# Patient Record
Sex: Female | Born: 2005 | Race: White | Hispanic: Yes | Marital: Single | Smoking: Never smoker
Health system: Southern US, Community
[De-identification: ages and names within clinical notes are randomized; demographics above are authoritative.]

---
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
## 2023-01-02 DIAGNOSIS — Z419 Encounter for procedure for purposes other than remedying health state, unspecified: Secondary | ICD-10-CM | POA: Diagnosis not present

## 2023-02-02 DIAGNOSIS — Z419 Encounter for procedure for purposes other than remedying health state, unspecified: Secondary | ICD-10-CM | POA: Diagnosis not present

## 2023-03-04 DIAGNOSIS — Z419 Encounter for procedure for purposes other than remedying health state, unspecified: Secondary | ICD-10-CM | POA: Diagnosis not present

## 2023-04-04 DIAGNOSIS — Z419 Encounter for procedure for purposes other than remedying health state, unspecified: Secondary | ICD-10-CM | POA: Diagnosis not present

## 2023-04-09 DIAGNOSIS — Z7182 Exercise counseling: Secondary | ICD-10-CM | POA: Diagnosis not present

## 2023-04-09 DIAGNOSIS — Z130 Encounter for screening for diseases of the blood and blood-forming organs and certain disorders involving the immune mechanism: Secondary | ICD-10-CM | POA: Diagnosis not present

## 2023-04-09 DIAGNOSIS — Z1159 Encounter for screening for other viral diseases: Secondary | ICD-10-CM | POA: Diagnosis not present

## 2023-04-09 DIAGNOSIS — Z713 Dietary counseling and surveillance: Secondary | ICD-10-CM | POA: Diagnosis not present

## 2023-04-09 DIAGNOSIS — Z13220 Encounter for screening for lipoid disorders: Secondary | ICD-10-CM | POA: Diagnosis not present

## 2023-04-09 DIAGNOSIS — N920 Excessive and frequent menstruation with regular cycle: Secondary | ICD-10-CM | POA: Diagnosis not present

## 2023-04-09 DIAGNOSIS — Z113 Encounter for screening for infections with a predominantly sexual mode of transmission: Secondary | ICD-10-CM | POA: Diagnosis not present

## 2023-04-09 DIAGNOSIS — E669 Obesity, unspecified: Secondary | ICD-10-CM | POA: Diagnosis not present

## 2023-04-09 DIAGNOSIS — Z1329 Encounter for screening for other suspected endocrine disorder: Secondary | ICD-10-CM | POA: Diagnosis not present

## 2023-04-09 DIAGNOSIS — Z0001 Encounter for general adult medical examination with abnormal findings: Secondary | ICD-10-CM | POA: Diagnosis not present

## 2023-04-09 DIAGNOSIS — N898 Other specified noninflammatory disorders of vagina: Secondary | ICD-10-CM | POA: Diagnosis not present

## 2023-04-09 DIAGNOSIS — Z131 Encounter for screening for diabetes mellitus: Secondary | ICD-10-CM | POA: Diagnosis not present

## 2023-04-09 DIAGNOSIS — Z23 Encounter for immunization: Secondary | ICD-10-CM | POA: Diagnosis not present

## 2023-04-09 DIAGNOSIS — Z114 Encounter for screening for human immunodeficiency virus [HIV]: Secondary | ICD-10-CM | POA: Diagnosis not present

## 2023-05-04 DIAGNOSIS — Z419 Encounter for procedure for purposes other than remedying health state, unspecified: Secondary | ICD-10-CM | POA: Diagnosis not present

## 2023-06-04 DIAGNOSIS — Z419 Encounter for procedure for purposes other than remedying health state, unspecified: Secondary | ICD-10-CM | POA: Diagnosis not present

## 2023-07-05 DIAGNOSIS — Z419 Encounter for procedure for purposes other than remedying health state, unspecified: Secondary | ICD-10-CM | POA: Diagnosis not present

## 2023-08-02 DIAGNOSIS — Z419 Encounter for procedure for purposes other than remedying health state, unspecified: Secondary | ICD-10-CM | POA: Diagnosis not present

## 2023-08-06 ENCOUNTER — Encounter (HOSPITAL_COMMUNITY): Payer: Self-pay

## 2023-08-06 ENCOUNTER — Ambulatory Visit (HOSPITAL_COMMUNITY)
Admission: EM | Admit: 2023-08-06 | Discharge: 2023-08-06 | Disposition: A | Discharge status: Home / Self Care | Attending: Physician Assistant | Admitting: Physician Assistant

## 2023-08-06 DIAGNOSIS — R300 Dysuria: Secondary | ICD-10-CM

## 2023-08-06 DIAGNOSIS — N3001 Acute cystitis with hematuria: Secondary | ICD-10-CM

## 2023-08-06 LAB — POCT URINALYSIS DIP (MANUAL ENTRY)
Bilirubin, UA: NEGATIVE
Glucose, UA: NEGATIVE mg/dL
Ketones, POC UA: NEGATIVE mg/dL
Nitrite, UA: NEGATIVE
Spec Grav, UA: 1.02 (ref 1.010–1.025)
Urobilinogen, UA: 0.2 U/dL
pH, UA: 7 (ref 5.0–8.0)

## 2023-08-06 LAB — POCT URINE PREGNANCY: Preg Test, Ur: NEGATIVE

## 2023-08-06 MED ORDER — NITROFURANTOIN MONOHYD MACRO 100 MG PO CAPS: 100.0000 mg | ORAL_CAPSULE | Freq: Two times a day (BID) | ORAL | 0 refills | Status: AC

## 2023-08-06 NOTE — ED Triage Notes (Signed)
 Pt states that she has some abdominal pressure, discoloration or urine and urinary frequency x1 week.

## 2023-08-06 NOTE — ED Provider Notes (Addendum)
 [facility]    CSN: [id number] Arrival date & time: 08/06/23  1557      History   Chief Complaint Chief Complaint  Patient presents with   Abdominal Pain    Abdominal pressure, discoloration of urine, and urinary frequency. X1 week    HPI Abigail Barnett is a 18 y.o. female.   Patient presents today companied by her mother who provide the majority of history.  Reports a 1 week history of UTI symptoms including pressure with urination, frequency, urgency.  She denies any hematuria, abdominal pain, fever, nausea, vomiting, pelvic pain, vaginal discharge.  She did take an at home UTI test that was negative.  She was treated for urinary tract infection on 06/10/2022 the emergency room with cephalexin.  Culture was not obtained at this time.  She has not seen a urologist.  Denies any recent urogenital procedure, self-catheterization.  Denies history of diabetes and does not take SGLT2 inhibitor.  She denies any recent antibiotics in the past 90 days.  No concern for pregnancy.    Past Medical History:  Diagnosis Date   Asthma     There are no active problems to display for this patient.   History reviewed. No pertinent surgical history.  OB History   No obstetric history on file.      Home Medications    Prior to Admission medications   Medication Sig Start Date End Date Taking? Authorizing Provider  nitrofurantoin, macrocrystal-monohydrate, (MACROBID) 100 MG capsule Take 1 capsule (100 mg total) by mouth 2 (two) times daily. 08/06/23  Yes Copelyn Widmer, Noberto Retort, PA-C    Family History History reviewed. No pertinent family history.  Social History Social History   Tobacco Use   Smoking status: Never   Smokeless tobacco: Never  Vaping Use   Vaping status: Never Used  Substance Use Topics   Alcohol use: No     Allergies   Patient has no known allergies.   Review of Systems Review of Systems  Constitutional:  Positive for activity change. Negative for  appetite change, fatigue and fever.  Gastrointestinal:  Negative for abdominal pain, diarrhea, nausea and vomiting.  Genitourinary:  Positive for dysuria, frequency and urgency. Negative for pelvic pain, vaginal bleeding, vaginal discharge and vaginal pain.     Physical Exam Triage Vital Signs ED Triage Vitals  Encounter Vitals Group     BP 08/06/23 1658 123/78     Systolic BP Percentile --      Diastolic BP Percentile --      Pulse Rate 08/06/23 1658 66     Resp 08/06/23 1658 19     Temp 08/06/23 1658 98.3 F (36.8 C)     Temp src --      SpO2 08/06/23 1658 98 %     Weight 08/06/23 1657 220 lb (99.8 kg)     Height 08/06/23 1657 5\' 5"  (1.651 m)     Head Circumference --      Peak Flow --      Pain Score 08/06/23 1657 0     Pain Loc --      Pain Education --      Exclude from Growth Chart --    No data found.  Updated Vital Signs BP 123/78 (BP Location: Left Arm)   Pulse 66   Temp 98.3 F (36.8 C)   Resp 19   Ht 5\' 5"  (1.651 m)   Wt 220 lb (99.8 kg)   LMP 07/10/2023   SpO2 98%  BMI 36.61 kg/m   Visual Acuity Right Eye Distance:   Left Eye Distance:   Bilateral Distance:    Right Eye Near:   Left Eye Near:    Bilateral Near:     Physical Exam Vitals reviewed.  Constitutional:      General: She is awake. She is not in acute distress.    Appearance: Normal appearance. She is well-developed. She is not ill-appearing.     Comments: Very pleasant female appears stated age in no acute distress sitting comfortably in exam room  HENT:     Head: Normocephalic and atraumatic.  Cardiovascular:     Rate and Rhythm: Normal rate and regular rhythm.     Heart sounds: Normal heart sounds, S1 normal and S2 normal. No murmur heard. Pulmonary:     Effort: Pulmonary effort is normal.     Breath sounds: Normal breath sounds. No wheezing, rhonchi or rales.     Comments: Clear to auscultation bilaterally Abdominal:     General: Bowel sounds are normal.     Palpations:  Abdomen is soft.     Tenderness: There is no abdominal tenderness. There is no right CVA tenderness, left CVA tenderness, guarding or rebound.     Comments: Benign abdominal exam  Psychiatric:        Behavior: Behavior is cooperative.      UC Treatments / Results  Labs (all labs ordered are listed, but only abnormal results are displayed) Labs Reviewed  POCT URINALYSIS DIP (MANUAL ENTRY) - Abnormal; Notable for the following components:      Result Value   Clarity, UA cloudy (*)    Blood, UA trace-intact (*)    Protein Ur, POC trace (*)    Leukocytes, UA Small (1+) (*)    All other components within normal limits  URINE CULTURE  POCT URINE PREGNANCY    EKG   Radiology No results found.  Procedures Procedures (including critical care time)  Medications Ordered in UC Medications - No data to display  Initial Impression / Assessment and Plan / UC Course  I have reviewed the triage vital signs and the nursing notes.  Pertinent labs & imaging results that were available during my care of the patient were reviewed by me and considered in my medical decision making (see chart for details).     Patient is well-appearing, afebrile, nontoxic, nontachycardic.  Urine pregnancy was negative.  UA concerning for urinary tract infection.  Will start Macrobid twice daily for 5 days.  We discussed that she should eat with this medication to prevent GI upset.  She is to push fluids and use over-the-counter analgesics as needed.  Will send her urine for culture and contact her if we need to discontinue or change her antibiotics based on susceptibilities identified on culture.  Discussed that if anything worsens or changes she needs to be seen immediately including high fever, abdominal pain, nausea, vomiting, hematuria, persistent UTI symptoms despite antibiotic use.  Strict return precautions given.  She declined excuse note.  Final Clinical Impressions(s) / UC Diagnoses   Final  diagnoses:  Acute cystitis with hematuria  Dysuria     Discharge Instructions      Start Macrobid twice daily for 5 days.  This can make your urine a dark color.  Make sure that you drink plenty of fluid.  We will contact you if we need to change or stop your antibiotics based on your culture results.  If your symptoms do not improve within  a few days or if anything worsens and you develop a fever, abdominal pain, nausea, vomiting, blood in your urine you should be seen immediately.  Follow-up with your primary care within a few weeks.     ED Prescriptions     Medication Sig Dispense Auth. Provider   nitrofurantoin, macrocrystal-monohydrate, (MACROBID) 100 MG capsule Take 1 capsule (100 mg total) by mouth 2 (two) times daily. 10 capsule Lashanda Storlie, Noberto Retort, PA-C      PDMP not reviewed this encounter.   Jeani Hawking, PA-C 08/06/23 1757    Jeani Hawking, PA-C 08/06/23 1806

## 2023-08-06 NOTE — Discharge Instructions (Signed)
 Start Macrobid twice daily for 5 days.  This can make your urine a dark color.  Make sure that you drink plenty of fluid.  We will contact you if we need to change or stop your antibiotics based on your culture results.  If your symptoms do not improve within a few days or if anything worsens and you develop a fever, abdominal pain, nausea, vomiting, blood in your urine you should be seen immediately.  Follow-up with your primary care within a few weeks.

## 2023-08-08 LAB — URINE CULTURE: Culture: 80000 — AB

## 2023-08-18 DIAGNOSIS — D229 Melanocytic nevi, unspecified: Secondary | ICD-10-CM | POA: Diagnosis not present

## 2023-08-18 DIAGNOSIS — F419 Anxiety disorder, unspecified: Secondary | ICD-10-CM | POA: Diagnosis not present

## 2023-08-18 DIAGNOSIS — N3000 Acute cystitis without hematuria: Secondary | ICD-10-CM | POA: Diagnosis not present

## 2023-09-13 DIAGNOSIS — Z419 Encounter for procedure for purposes other than remedying health state, unspecified: Secondary | ICD-10-CM | POA: Diagnosis not present

## 2023-10-07 DIAGNOSIS — Z23 Encounter for immunization: Secondary | ICD-10-CM | POA: Diagnosis not present

## 2023-10-13 DIAGNOSIS — Z419 Encounter for procedure for purposes other than remedying health state, unspecified: Secondary | ICD-10-CM | POA: Diagnosis not present

## 2023-10-24 DIAGNOSIS — D224 Melanocytic nevi of scalp and neck: Secondary | ICD-10-CM | POA: Diagnosis not present

## 2023-11-13 DIAGNOSIS — Z419 Encounter for procedure for purposes other than remedying health state, unspecified: Secondary | ICD-10-CM | POA: Diagnosis not present

## 2023-12-13 DIAGNOSIS — Z419 Encounter for procedure for purposes other than remedying health state, unspecified: Secondary | ICD-10-CM | POA: Diagnosis not present

## 2024-01-13 DIAGNOSIS — Z419 Encounter for procedure for purposes other than remedying health state, unspecified: Secondary | ICD-10-CM | POA: Diagnosis not present

## 2024-02-13 DIAGNOSIS — Z419 Encounter for procedure for purposes other than remedying health state, unspecified: Secondary | ICD-10-CM | POA: Diagnosis not present

## 2024-04-14 DIAGNOSIS — Z419 Encounter for procedure for purposes other than remedying health state, unspecified: Secondary | ICD-10-CM | POA: Diagnosis not present

## 2024-05-14 DIAGNOSIS — Z419 Encounter for procedure for purposes other than remedying health state, unspecified: Secondary | ICD-10-CM | POA: Diagnosis not present
